# Patient Record
Sex: Female | Born: 2000
Health system: Southern US, Community
[De-identification: ages and names within clinical notes are randomized; demographics above are authoritative.]

## PROBLEM LIST (undated history)

## (undated) DIAGNOSIS — B081 Molluscum contagiosum: Secondary | ICD-10-CM

## (undated) DIAGNOSIS — M419 Scoliosis, unspecified: Secondary | ICD-10-CM

## (undated) HISTORY — DX: Molluscum contagiosum: B08.1

## (undated) HISTORY — DX: Scoliosis, unspecified: M41.9

## (undated) HISTORY — PX: TONSILLECTOMY AND ADENOIDECTOMY: SUR1326

---
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
## 2022-01-22 ENCOUNTER — Other Ambulatory Visit: Payer: Self-pay | Admitting: Family Medicine

## 2022-01-22 NOTE — Telephone Encounter (Signed)
Last OV-02/08/21 Last refill-10/23/21  No future OV scheduled.   Can this patient receive a refill.

## 2022-02-15 ENCOUNTER — Telehealth: Payer: Self-pay | Admitting: Family Medicine

## 2022-02-15 MED ORDER — CEFUROXIME AXETIL 500 MG PO TABS: 500.0000 mg | ORAL_TABLET | Freq: Two times a day (BID) | ORAL | 0 refills | Status: AC

## 2022-02-15 NOTE — Telephone Encounter (Signed)
She has a dry cough and a bad ST. No fever. She has been exposed to a strep infection, so she likely has strep throat. We will treat with 10 days of Cefuroxime.

## 2022-02-19 ENCOUNTER — Encounter: Payer: Self-pay | Admitting: Family Medicine

## 2022-02-19 ENCOUNTER — Ambulatory Visit: Payer: BC Managed Care – PPO | Admitting: Family Medicine

## 2022-02-19 VITALS — BP 118/78 | HR 87 | Temp 98.5°F | Wt 132.2 lb

## 2022-02-19 DIAGNOSIS — J020 Streptococcal pharyngitis: Secondary | ICD-10-CM

## 2022-02-19 NOTE — Progress Notes (Signed)
   Subjective:    Patient ID: Angelica House, female    DOB: [birth year], 21 y.o.   MRN: [id number]  HPI Here for 5 days of ST and a dry cough. She has felt hot and cold but has not registered a fever. No body aches or SOB. No NVD. She is a Ship broker at [employer], and last week we called in a 10 day course of Cefuroxime. She is now on day 5 of this. She felt bad yesterday but today she feels better. The ST is not as intense and the cough is easing up. Her ribs have been sore from the coughing. She has been using Nyquil.    Review of Systems  Constitutional: Negative.   HENT:  Positive for congestion, postnasal drip and sore throat. Negative for ear pain and sinus pressure.   Eyes: Negative.   Respiratory:  Positive for cough. Negative for shortness of breath and wheezing.   Gastrointestinal: Negative.        Objective:   Physical Exam Constitutional:      Appearance: Normal appearance. She is not ill-appearing.  HENT:     Right Ear: Tympanic membrane, ear canal and external ear normal.     Left Ear: Tympanic membrane, ear canal and external ear normal.     Nose: Nose normal.     Mouth/Throat:     Pharynx: Posterior oropharyngeal erythema present. No oropharyngeal exudate.  Eyes:     Conjunctiva/sclera: Conjunctivae normal.  Pulmonary:     Effort: Pulmonary effort is normal.     Breath sounds: Normal breath sounds. No wheezing, rhonchi or rales.  Lymphadenopathy:     Cervical: Cervical adenopathy present.  Neurological:     Mental Status: She is alert.           Assessment & Plan:  Strep pharyngitis. She is on day 5 of Cefuroxime, and she seems to be getting better. She will finish out the antibiotic. Use Delsym as needed for the cough. She is written out of classes from 02-15-22 until 02-21-22.  [name], MD

## 2022-03-12 ENCOUNTER — Telehealth: Payer: Self-pay | Admitting: Family Medicine

## 2022-03-12 MED ORDER — CEFUROXIME AXETIL 500 MG PO TABS: 500.0000 mg | ORAL_TABLET | Freq: Two times a day (BID) | ORAL | 0 refills | Status: AC

## 2022-03-12 NOTE — Telephone Encounter (Signed)
The ST is back

## 2022-05-28 ENCOUNTER — Encounter: Payer: Self-pay | Admitting: Family Medicine

## 2022-05-28 ENCOUNTER — Telehealth (INDEPENDENT_AMBULATORY_CARE_PROVIDER_SITE_OTHER): Payer: Self-pay | Admitting: Family Medicine

## 2022-05-28 DIAGNOSIS — F419 Anxiety disorder, unspecified: Secondary | ICD-10-CM

## 2022-05-28 MED ORDER — ESCITALOPRAM OXALATE 10 MG PO TABS: 10.0000 mg | ORAL_TABLET | Freq: Every day | ORAL | 2 refills | Status: DC

## 2022-05-28 NOTE — Progress Notes (Signed)
   Subjective:    Patient ID: Angelica House, female    DOB: [birth year], 22 y.o.   MRN: [id number]  HPI Virtual Visit via Video Note  I connected with the patient on 05/28/22 at  1:45 PM EST by a video enabled telemedicine application and verified that I am speaking with the correct person using two identifiers.  Location patient: home Location provider:work or home office Persons participating in the virtual visit: patient, provider  I discussed the limitations of evaluation and management by telemedicine and the availability of in person appointments. The patient expressed understanding and agreed to proceed.   HPI: Here asking for help with anxiety. This has been a problem for her really in the past year. She attends [employer], and she [employer] college life a lot. However she often feels anxious and she worries about classes, friends, her sorority activities, etc. She also says she is very "obsessive-compulsive", especially when she feels anxious. She tends to clean her dorm room much more often than she needs to. She denies much in the way of depression symptoms. Appetite and sleep are intact.    ROS: See pertinent positives and negatives per HPI.  Past Medical History:  Diagnosis Date   Molluscum contagiosum    Scoliosis     Past Surgical History:  Procedure Laterality Date   SPINE SURGERY  01/2014   posterior fusion T4 to L3 per Dr. Chiquita Loth at [place]      History reviewed. No pertinent family history.   Current Outpatient Medications:    drospirenone-ethinyl estradiol (YAZ) 3-0.02 MG tablet, TAKE 1 TABLET DAILY, Disp: 84 tablet, Rfl: 3   escitalopram (LEXAPRO) 10 MG tablet, Take 1 tablet (10 mg total) by mouth daily., Disp: 30 tablet, Rfl: 2   valACYclovir (VALTREX) 1000 MG tablet, Take 1 tablet (1,000 mg total) by mouth 2 (two) times daily as needed (for cold sores)., Disp: 60 tablet, Rfl: 5  EXAM:  VITALS per patient if  applicable:  GENERAL: alert, oriented, appears well and in no acute distress  HEENT: atraumatic, conjunttiva clear, no obvious abnormalities on inspection of external nose and ears  NECK: normal movements of the head and neck  LUNGS: on inspection no signs of respiratory distress, breathing rate appears normal, no obvious gross SOB, gasping or wheezing  CV: no obvious cyanosis  MS: moves all visible extremities without noticeable abnormality  PSYCH/NEURO: pleasant and cooperative, no obvious depression or anxiety, speech and thought processing grossly intact  ASSESSMENT AND PLAN: Anxiety. We discussed ways to treat this, including psychotherapy and medication. She is considering talking to a school counselor. We will also starther on Lexapro 10 mg daily. We will follow up in 4 weeks.  Angelica Penna, MD  Discussed the following assessment and plan:  No diagnosis found.     I discussed the assessment and treatment plan with the patient. The patient was provided an opportunity to ask questions and all were answered. The patient agreed with the plan and demonstrated an understanding of the instructions.   The patient was advised to call back or seek an in-person evaluation if the symptoms worsen or if the condition fails to improve as anticipated.      Review of Systems     Objective:   Physical Exam        Assessment & Plan:

## 2022-06-22 ENCOUNTER — Other Ambulatory Visit: Payer: Self-pay | Admitting: Family Medicine

## 2022-08-20 DIAGNOSIS — Z113 Encounter for screening for infections with a predominantly sexual mode of transmission: Secondary | ICD-10-CM | POA: Diagnosis not present

## 2022-09-03 ENCOUNTER — Telehealth: Payer: Self-pay | Admitting: Family Medicine

## 2022-09-03 MED ORDER — CIPROFLOXACIN HCL 500 MG PO TABS: 500.0000 mg | ORAL_TABLET | Freq: Two times a day (BID) | ORAL | 0 refills | Status: DC

## 2022-09-03 NOTE — Telephone Encounter (Signed)
She is having burning on urination

## 2022-09-10 ENCOUNTER — Other Ambulatory Visit: Payer: Self-pay | Admitting: Family Medicine

## 2022-10-18 ENCOUNTER — Telehealth: Payer: Self-pay

## 2022-10-18 NOTE — Telephone Encounter (Signed)
LVM for patient to call back [phone], or to call PCP office to schedule follow up apt. AS, CMA  

## 2022-11-29 ENCOUNTER — Other Ambulatory Visit: Payer: Self-pay | Admitting: Family Medicine

## 2022-12-31 ENCOUNTER — Telehealth: Payer: Self-pay | Admitting: Family Medicine

## 2022-12-31 MED ORDER — AZITHROMYCIN 250 MG PO TABS: ORAL_TABLET | ORAL | 0 refills | Status: DC

## 2022-12-31 NOTE — Telephone Encounter (Signed)
She has had 5 days of sinus pressure, headache, and a dry cough. No fever. She has tested negative for Covid. We will send in a Zpack

## 2023-02-16 ENCOUNTER — Other Ambulatory Visit: Payer: Self-pay | Admitting: Family Medicine

## 2023-02-19 NOTE — Telephone Encounter (Signed)
Done

## 2023-04-07 ENCOUNTER — Other Ambulatory Visit: Payer: Self-pay | Admitting: Family Medicine

## 2023-04-11 ENCOUNTER — Telehealth: Payer: Self-pay | Admitting: Family Medicine

## 2023-04-11 NOTE — Telephone Encounter (Signed)
Done

## 2023-08-03 ENCOUNTER — Other Ambulatory Visit: Payer: Self-pay | Admitting: Family Medicine

## 2023-11-05 ENCOUNTER — Encounter: Payer: Self-pay | Admitting: Family Medicine

## 2023-12-19 ENCOUNTER — Other Ambulatory Visit: Payer: Self-pay | Admitting: Family Medicine

## 2023-12-24 MED ORDER — DROSPIRENONE-ETHINYL ESTRADIOL 3-0.02 MG PO TABS: 1.0000 | ORAL_TABLET | Freq: Every day | ORAL | 3 refills | Status: AC

## 2023-12-24 NOTE — Telephone Encounter (Signed)
 Done

## 2023-12-30 DIAGNOSIS — J069 Acute upper respiratory infection, unspecified: Secondary | ICD-10-CM | POA: Diagnosis not present

## 2024-01-03 ENCOUNTER — Telehealth: Payer: Self-pay | Admitting: Family Medicine

## 2024-01-03 MED ORDER — AZITHROMYCIN 250 MG PO TABS: ORAL_TABLET | ORAL | 0 refills | Status: AC

## 2024-01-03 NOTE — Telephone Encounter (Signed)
 For a sinusitis
